# Patient Record
Sex: Male | Born: 1990 | Race: White | Hispanic: No | Marital: Single | Smoking: Current some day smoker
Health system: Southern US, Community
[De-identification: ages and names within clinical notes are randomized; demographics above are authoritative.]

---
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
## 2011-01-31 NOTE — Op Note (Signed)
NAME:  Willie Blake, Willie Blake                      ACCOUNT NO.:  [id number]   MEDICAL RECORD NO.:  [id number]                   PATIENT TYPE:  AMB   LOCATION:  DSC                                  FACILITY:  [facility]   PHYSICIAN:  Alfredia Ferguson, M.D.               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   OPERATION PERFORMED:  Complete excision, congenital pigmented nevus, left  upper lateral buttocks with primary closure.   SURGEON:  Alfredia Ferguson, M.D.   ANESTHESIA:  General endotracheal.   INDICATIONS FOR PROCEDURE:  This is an 20 year old male with an enlarging  congenital nevus with multiple very dark areas within the confines of this  nevus.  It has been recommended by his dermatologist that it be removed.  Plan is to attempt to remove this in its entirety.  The family understands  the risks of unsightly scarring and residual lesion following excision.  In  spite of that, the family wishes for me to proceed with the surgery.   DESCRIPTION OF PROCEDURE:  After adequate general endotracheal anesthesia  had been induced, the patient was rolled into a prone position with all  pressure points well padded.  Skin markers were marked outlining an  elliptical incision transversely oriented with approximately 2 mm margins  around the lesion.  15 mL of 0.5% Marcaine 1:200,000 epinephrine was  infiltrated beneath the nevus.  The buttock area was prepped with Betadine  and draped with sterile drapes.  Elliptical excision of the lesion down to  level of the subcutaneous tissue was carried out.  The lesion was removed in  two portions to ensure that I could close the area without tension.  The  first portion was removed and  it was clear that the rest could be removed  without undue tension.  The second portion was then removed.  Hemostasis was  meticulously maintained throughout the dissection using electrocautery.  The  wound edges were undermined for a distance of about a centimeter in all  directions.  The wound was closed by approximating the dermis using multiple  interrupted 3-0 Monocryl sutures to the dermis followed by a running 3-0  Monocryl subcuticular to the  skin edge.  Steri-Strips were applied.  A  light dressing was applied and the patient was rolled back into the supine  position where he was extubated.  The patient tolerated the procedure well  with minimal blood loss.  He was transported to the recovery room in  satisfactory condition.  Alfredia Ferguson, M.D.    WBB/MEDQ  D:  10/09/2003  T:  10/09/2003  Job:  [id number]

## 2017-03-10 ENCOUNTER — Encounter (HOSPITAL_COMMUNITY): Payer: Self-pay | Admitting: *Deleted

## 2017-03-10 ENCOUNTER — Emergency Department (HOSPITAL_COMMUNITY)
Admission: EM | Admit: 2017-03-10 | Discharge: 2017-03-10 | Disposition: A | Discharge status: Home / Self Care | Payer: Worker's Compensation | Attending: Emergency Medicine | Admitting: Emergency Medicine

## 2017-03-10 DIAGNOSIS — H5712 Ocular pain, left eye: Secondary | ICD-10-CM

## 2017-03-10 DIAGNOSIS — Y9289 Other specified places as the place of occurrence of the external cause: Secondary | ICD-10-CM | POA: Insufficient documentation

## 2017-03-10 DIAGNOSIS — F17200 Nicotine dependence, unspecified, uncomplicated: Secondary | ICD-10-CM | POA: Insufficient documentation

## 2017-03-10 DIAGNOSIS — Y990 Civilian activity done for income or pay: Secondary | ICD-10-CM | POA: Insufficient documentation

## 2017-03-10 DIAGNOSIS — W208XXA Other cause of strike by thrown, projected or falling object, initial encounter: Secondary | ICD-10-CM | POA: Diagnosis not present

## 2017-03-10 DIAGNOSIS — Y9389 Activity, other specified: Secondary | ICD-10-CM | POA: Insufficient documentation

## 2017-03-10 DIAGNOSIS — T1502XA Foreign body in cornea, left eye, initial encounter: Secondary | ICD-10-CM | POA: Insufficient documentation

## 2017-03-10 MED ORDER — FLUORESCEIN SODIUM 0.6 MG OP STRP
1.0000 | ORAL_STRIP | Freq: Once | OPHTHALMIC | Status: AC
  Administered 2017-03-10: 1 via OPHTHALMIC
  Filled 2017-03-10: qty 1

## 2017-03-10 MED ORDER — TETRACAINE HCL 0.5 % OP SOLN
1.0000 [drp] | Freq: Once | OPHTHALMIC | Status: AC
  Administered 2017-03-10: 1 [drp] via OPHTHALMIC
  Filled 2017-03-10: qty 4

## 2017-03-10 MED ORDER — ERYTHROMYCIN 5 MG/GM OP OINT: TOPICAL_OINTMENT | OPHTHALMIC | 0 refills | Status: AC

## 2017-03-10 MED ORDER — ERYTHROMYCIN 5 MG/GM OP OINT
1.0000 "application " | TOPICAL_OINTMENT | Freq: Once | OPHTHALMIC | Status: AC
  Administered 2017-03-10: 1 via OPHTHALMIC
  Filled 2017-03-10: qty 3.5

## 2017-03-10 NOTE — ED Notes (Addendum)
Pt presents with L eye pain; thinks he got metal shaving in there during work.  See EDP secondary assessment.

## 2017-03-10 NOTE — ED Triage Notes (Signed)
Pt arrives from work after reportedly getting a piece of metal in his eye. Pt left eye is pink, no metal noted. Pt denies loss of vision, double/blurry vision or tearing.

## 2017-03-10 NOTE — ED Provider Notes (Signed)
[facility] DEPT Provider Note   CSN: [id number] Arrival date & time: 03/10/17  1939  By signing my name below, I, Marcello Moores, attest that this documentation has been prepared under the direction and in the presence of Providence Lanius, [place]. Electronically Signed: Marcello Moores, ED Scribe. 03/10/17. 9:55 PM.  History   Chief Complaint No chief complaint on file.  The history is provided by the patient. No language interpreter was used.   HPI Comments: Willie Blake is a 26 y.o. male who presents to the Emergency Department complaining of mild left eye pain That began at 6 PM while he was at work. 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 He was wearing protective goggles at the time but thinks that piece of metal got into the goggles into the eye. He reports some associated redness to the eye. He states the he tried to flush out the foreign body with no relief. He denies blurry vision and photophobia.   History reviewed. No pertinent past medical history.  There are no active problems to display for this patient.   History reviewed. No pertinent surgical history.     Home Medications    Prior to Admission medications   Medication Sig Start Date End Date Taking? Authorizing Provider  erythromycin ophthalmic ointment Place a 1/2 inch ribbon of ointment into the lower eyelid. 03/10/17   Volanda Napoleon, PA-C    Family History History reviewed. No pertinent family history.  Social History Social History  Substance Use Topics  . Smoking status: Current Some Day Smoker  . Smokeless tobacco: Never Used  . Alcohol use Yes     Allergies   Patient has no known allergies.   Review of Systems Review of Systems  Eyes: Positive for pain and redness. Negative for visual disturbance.  All other systems reviewed and are negative.    Physical Exam Updated Vital Signs BP 129/85 (BP Location: Right  Arm)   Pulse 78   Temp 98.3 F (36.8 C) (Oral)   Resp 16   SpO2 100%   Physical Exam  Constitutional: He appears well-developed and well-nourished.  Sitting comfortably on examination table  HENT:  Head: Normocephalic and atraumatic.  Eyes: EOM are normal. Pupils are equal, round, and reactive to light. Right eye exhibits no discharge. No foreign body present in the right eye. Foreign body present in the left eye. Right conjunctiva is not injected. Left conjunctiva is injected. No scleral icterus.  No abnormalities of the right eye. Left eye with conjunctival injection. Fundoscopic exam shows metal foreign body to the inferior lateral aspect of the cornea.  EOMs intact without difficulty. No tenderness to palpation of the periorbital regions bilaterally.   Pulmonary/Chest: Effort normal.  Neurological: He is alert.  Skin: Skin is warm and dry.  Psychiatric: He has a normal mood and affect. His speech is normal and behavior is normal.  Nursing note and vitals reviewed.    ED Treatments / Results   DIAGNOSTIC STUDIES: Oxygen Saturation is 100% on RA, normal by my interpretation.   COORDINATION OF CARE: 9:30 PM-Discussed next steps with pt. Pt verbalized understanding and is agreeable with the plan.   Labs (all labs ordered are listed, but only abnormal results are displayed) Labs Reviewed - No data to display  EKG  EKG Interpretation None       Radiology No results found.  Procedures Procedures (including critical care time)  Medications Ordered in ED Medications  tetracaine (PONTOCAINE) 0.5 % ophthalmic solution 1 drop (1 drop Both Eyes Given by Other 03/10/17 2201)  fluorescein ophthalmic strip 1 strip (1 strip Both Eyes Given by Other 03/10/17 2201)  erythromycin ophthalmic ointment 1 application (1 application Left Eye Given 03/10/17 2317)     Initial Impression / Assessment and Plan / ED Course  I have reviewed the triage vital signs and the nursing  notes.  Pertinent labs & imaging results that were available during my care of the patient were reviewed by me and considered in my medical decision making (see chart for details).     26 yo M Who presents with left eye pain that began this afternoon while work. Patient is afebrile, non-toxic appearing, sitting comfortably on examination table. Funduscopic exam shows obvious metal foreign body to the lateral inferior aspect of the cornea. Will plan to evaluate with Woods lamp for evidence of corneal abrasion.   Woods lamp shows no fluorescein uptake. Slit-lamp evaluation shows shows a small metallic foreign body to the lateral inferior aspect of the cornea. Attempted removal of foreign body but was unsuccessful.   Discussed case with Dr. Alvino Chapel who successfully removed the foreign body from the cornea. Will plan to provide antibiotic coverage. Antibiotics provided in the department. Patient instructed follow-up with ophthalmology referral tomorrow. Strict return precautions discussed. Patient expresses understanding and agreement to plan.   Final Clinical Impressions(s) / ED Diagnoses   Final diagnoses:  Pain of left eye  Foreign body of left cornea, initial encounter    New Prescriptions Discharge Medication List as of 03/10/2017 11:10 PM    START taking these medications   Details  erythromycin ophthalmic ointment Place a 1/2 inch ribbon of ointment into the lower eyelid., Print       I personally performed the services described in this documentation, which was scribed in my presence. The recorded information has been reviewed and is accurate.     Volanda Napoleon, PA-C 03/10/17 2331    Davonna Belling, MD 03/11/17 [phone]

## 2017-03-10 NOTE — Discharge Instructions (Signed)
Use antibiotics as directed.   Follow-up with the referred opthalmology doctor. Call his office tomorrow morning to arrange an appointment.   Return to the Emergency Department for any worsening pain, vision changes, fever, swelling of the eyelid or any other concerns.   If you do not have a primary care doctor you see regularly, please you the list below. Please call them to arrange for follow-up.    No Primary Care Doctor Call [place] Other agencies that provide inexpensive medical care    [place]  [phone]    [facility]  [place]  [phone]    [facility]  [phone]    [facility]  [phone]    [facility]  [phone]

## 2017-03-12 DIAGNOSIS — T1502XA Foreign body in cornea, left eye, initial encounter: Secondary | ICD-10-CM | POA: Diagnosis not present

## 2017-03-12 DIAGNOSIS — H18892 Other specified disorders of cornea, left eye: Secondary | ICD-10-CM | POA: Diagnosis not present

## 2017-03-12 DIAGNOSIS — H11432 Conjunctival hyperemia, left eye: Secondary | ICD-10-CM | POA: Diagnosis not present

## 2017-03-19 DIAGNOSIS — T1502XA Foreign body in cornea, left eye, initial encounter: Secondary | ICD-10-CM | POA: Diagnosis not present

## 2017-10-15 DIAGNOSIS — Z6824 Body mass index (BMI) 24.0-24.9, adult: Secondary | ICD-10-CM | POA: Diagnosis not present

## 2017-10-15 DIAGNOSIS — B070 Plantar wart: Secondary | ICD-10-CM | POA: Diagnosis not present

## 2017-11-06 DIAGNOSIS — Z6824 Body mass index (BMI) 24.0-24.9, adult: Secondary | ICD-10-CM | POA: Diagnosis not present

## 2017-11-06 DIAGNOSIS — B070 Plantar wart: Secondary | ICD-10-CM | POA: Diagnosis not present

## 2017-11-16 DIAGNOSIS — B079 Viral wart, unspecified: Secondary | ICD-10-CM | POA: Diagnosis not present

## 2017-12-07 DIAGNOSIS — B079 Viral wart, unspecified: Secondary | ICD-10-CM | POA: Diagnosis not present

## 2017-12-24 DIAGNOSIS — B070 Plantar wart: Secondary | ICD-10-CM | POA: Diagnosis not present

## 2018-01-21 DIAGNOSIS — B079 Viral wart, unspecified: Secondary | ICD-10-CM | POA: Diagnosis not present

## 2018-02-18 DIAGNOSIS — B079 Viral wart, unspecified: Secondary | ICD-10-CM | POA: Diagnosis not present

## 2018-03-11 ENCOUNTER — Other Ambulatory Visit: Payer: Self-pay

## 2018-03-11 DIAGNOSIS — D239 Other benign neoplasm of skin, unspecified: Secondary | ICD-10-CM | POA: Diagnosis not present

## 2018-04-12 DIAGNOSIS — B070 Plantar wart: Secondary | ICD-10-CM | POA: Diagnosis not present

## 2018-04-26 DIAGNOSIS — L0202 Furuncle of face: Secondary | ICD-10-CM | POA: Diagnosis not present

## 2018-04-26 DIAGNOSIS — L739 Follicular disorder, unspecified: Secondary | ICD-10-CM | POA: Diagnosis not present

## 2018-05-03 DIAGNOSIS — Z23 Encounter for immunization: Secondary | ICD-10-CM | POA: Diagnosis not present

## 2018-05-03 DIAGNOSIS — L739 Follicular disorder, unspecified: Secondary | ICD-10-CM | POA: Diagnosis not present

## 2018-05-21 DIAGNOSIS — B070 Plantar wart: Secondary | ICD-10-CM | POA: Diagnosis not present

## 2018-06-21 DIAGNOSIS — B079 Viral wart, unspecified: Secondary | ICD-10-CM | POA: Diagnosis not present

## 2018-08-20 DIAGNOSIS — B079 Viral wart, unspecified: Secondary | ICD-10-CM | POA: Diagnosis not present

## 2019-06-23 ENCOUNTER — Other Ambulatory Visit: Payer: Self-pay

## 2019-06-23 DIAGNOSIS — Z20822 Contact with and (suspected) exposure to covid-19: Secondary | ICD-10-CM

## 2019-06-24 LAB — NOVEL CORONAVIRUS, NAA: SARS-CoV-2, NAA: NOT DETECTED

## 2020-01-11 ENCOUNTER — Other Ambulatory Visit: Payer: Self-pay | Admitting: Nurse Practitioner

## 2020-01-11 ENCOUNTER — Other Ambulatory Visit: Payer: Self-pay

## 2020-01-11 ENCOUNTER — Ambulatory Visit
Admission: RE | Admit: 2020-01-11 | Discharge: 2020-01-11 | Disposition: A | Discharge status: Home / Self Care | Payer: Self-pay | Source: Ambulatory Visit | Attending: Nurse Practitioner | Admitting: Nurse Practitioner

## 2020-01-11 DIAGNOSIS — S67196A Crushing injury of right little finger, initial encounter: Secondary | ICD-10-CM

## 2021-08-12 IMAGING — CR DG FINGER LITTLE 2+V*R*
3 series · 3 of 3 positions shown · non-contrast
Comparison: None.

CLINICAL DATA: Distal tuft right fifth finger crush injury this
morning

EXAM:
RIGHT LITTLE FINGER 2+V

[x finger pa right]
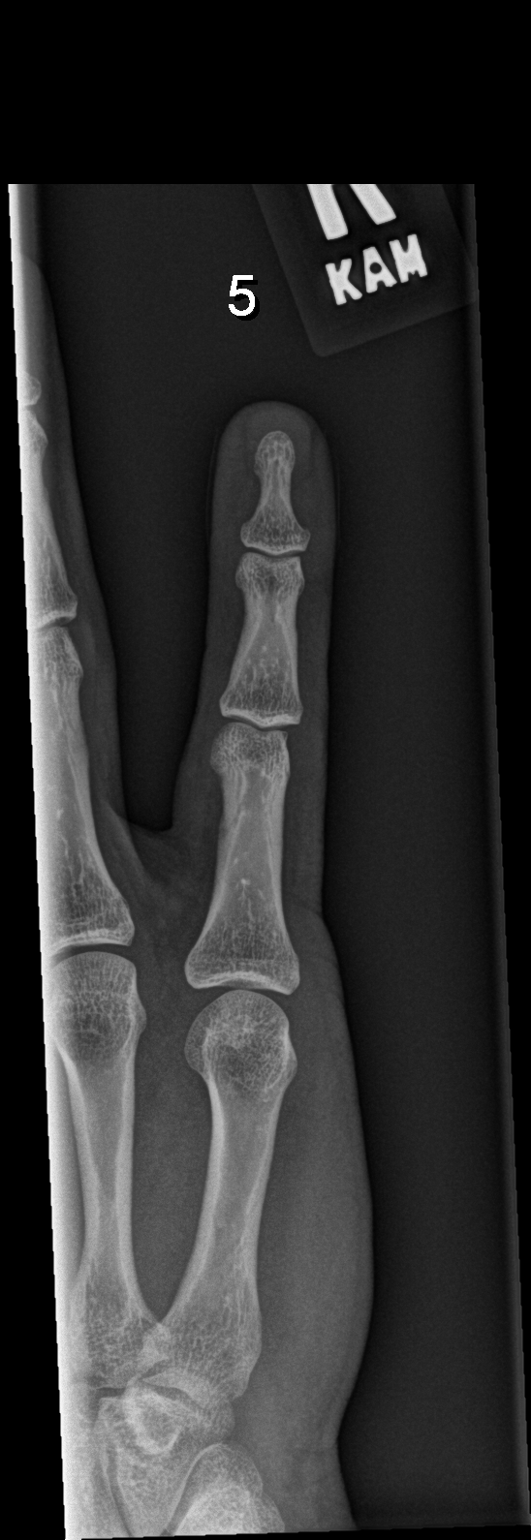

[x finger lat right]
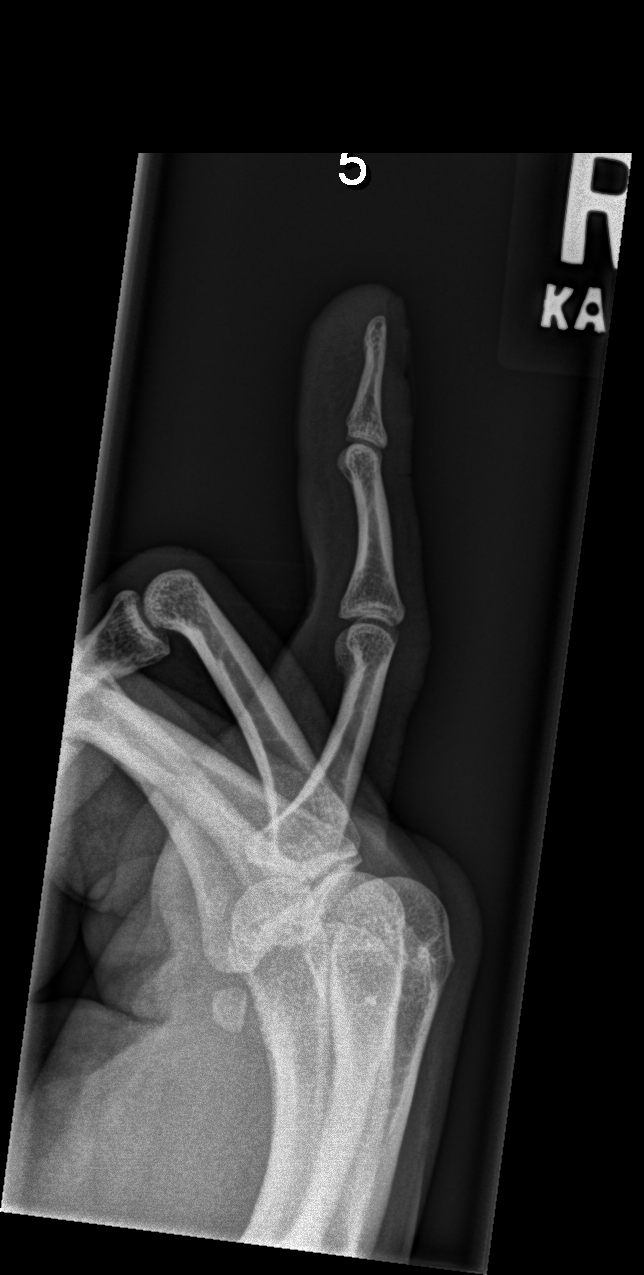

[x finger obl right]
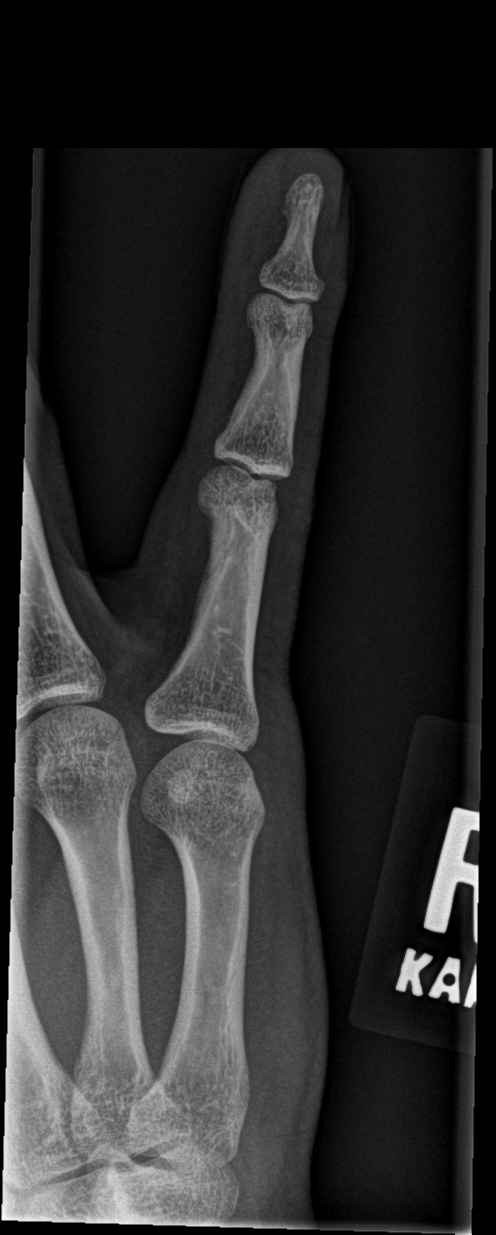

[3 of 3 positions shown; findings below may reference images not displayed]

FINDINGS: There is no evidence of fracture or dislocation. There is no
evidence of arthropathy or other focal bone abnormality. Soft
tissues are unremarkable.
IMPRESSION: No fracture or malalignment.
